# Patient Record
Sex: Male | Born: 1955 | Race: White | Hispanic: No | Marital: Married | Smoking: Never smoker
Health system: Southern US, Community
[De-identification: ages and names within clinical notes are randomized; demographics above are authoritative.]

## PROBLEM LIST (undated history)

## (undated) DIAGNOSIS — I219 Acute myocardial infarction, unspecified: Secondary | ICD-10-CM

## (undated) DIAGNOSIS — I10 Essential (primary) hypertension: Secondary | ICD-10-CM

## (undated) HISTORY — PX: CORONARY ARTERY BYPASS GRAFT: SHX141

---

## 2005-08-11 ENCOUNTER — Ambulatory Visit: Payer: Self-pay | Admitting: General Surgery

## 2007-01-27 ENCOUNTER — Encounter: Admission: RE | Admit: 2007-01-27 | Discharge: 2007-01-27 | Payer: Self-pay | Admitting: Specialist

## 2007-09-20 ENCOUNTER — Encounter: Admission: RE | Admit: 2007-09-20 | Discharge: 2007-09-20 | Payer: Self-pay | Admitting: Specialist

## 2010-12-03 ENCOUNTER — Inpatient Hospital Stay: Payer: Self-pay | Admitting: Internal Medicine

## 2010-12-18 ENCOUNTER — Ambulatory Visit: Payer: Self-pay | Admitting: Internal Medicine

## 2011-03-10 ENCOUNTER — Encounter: Payer: Self-pay | Admitting: Internal Medicine

## 2011-03-23 ENCOUNTER — Encounter: Payer: Self-pay | Admitting: Internal Medicine

## 2011-04-23 ENCOUNTER — Encounter: Payer: Self-pay | Admitting: Internal Medicine

## 2011-05-24 ENCOUNTER — Encounter: Payer: Self-pay | Admitting: Internal Medicine

## 2011-09-12 ENCOUNTER — Other Ambulatory Visit: Payer: Self-pay | Admitting: Internal Medicine

## 2011-09-12 ENCOUNTER — Ambulatory Visit: Payer: Self-pay | Admitting: Internal Medicine

## 2011-09-15 ENCOUNTER — Ambulatory Visit: Payer: Self-pay | Admitting: Internal Medicine

## 2011-10-30 ENCOUNTER — Ambulatory Visit: Payer: Self-pay | Admitting: Family Medicine

## 2011-12-16 ENCOUNTER — Encounter: Payer: Self-pay | Admitting: Internal Medicine

## 2011-12-22 ENCOUNTER — Encounter: Payer: Self-pay | Admitting: Internal Medicine

## 2012-01-21 ENCOUNTER — Encounter: Payer: Self-pay | Admitting: Internal Medicine

## 2012-02-21 ENCOUNTER — Encounter: Payer: Self-pay | Admitting: Internal Medicine

## 2013-11-18 ENCOUNTER — Ambulatory Visit: Payer: Self-pay | Admitting: Gastroenterology

## 2015-01-14 NOTE — H&P (Signed)
PATIENT NAME:  Derrick [name], Derrick [name] MR#:  [id number] DATE OF BIRTH:  Jan 27, 1956  DATE OF ADMISSION:  09/15/2011  REFERRING PHYSICIAN:  Dr. Sherrie MustacheFisher  DATE OF CARDIAC CATHETERIZATION: 09/15/2011  INDICATION: Unstable angina, coronary artery disease.   HISTORY OF PRESENT ILLNESS: Mr. 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  He underwent angioplasty and stenting of the distal RCA with [name] DES stent and did reasonably well. He was treated with beta blockers, ACE inhibitor, statin, Effient, and aspirin and did reasonably well. He was able to return to work, but over the last month or two he has had worsening chest discomfort on exertion, mostly with moderate exertion, sometimes at rest.  It got progressively worse. Because it was midsternal he finally presented and had [name] functional study [name] few days ago which showed diffuse ST-segment depression of 3 to 4 mm. He also had apical ischemia on the Myoview. He was then referred for cardiac catheterization today for further evaluation and management.   REVIEW OF SYSTEMS: No blackout spells or syncope. No nausea or vomiting. No fever, no chills, no sweats. No weight loss, no weight gain, no hemoptysis, no hematemesis, no bright red blood per rectum.   PAST MEDICAL HISTORY:  1. Angina.  2. Coronary artery disease.  3. Hypertension.  4. Hyperlipidemia.  5. Minimal elevation of troponin.   FAMILY HISTORY: Positive for cardiac disease.   SOCIAL HISTORY: Married, children. Still works. No smoking or alcohol consumption.  PAST SURGICAL HISTORY: Angioplasty and stenting with stent 11/2010.   Pulse oximetry 98%.   MEDICATIONS:  1. Aspirin 81 mg [name] day.  2. Metoprolol 12.5 twice [name] day.  3. Lisinopril 5 mg [name] day.  4. Lipitor 80 [name] day. 5. Effient 10 mg [name] day.   ALLERGIES: None.   PHYSICAL EXAMINATION:  VITAL SIGNS: Blood pressure 140/77,  pulse 50, respiratory rate 16, afebrile. Weight 147.  HEENT: Normocephalic, atraumatic. Pupils equal and reactive to light.   NECK: Supple. No JVD.  No carotid bruits or adenopathy.  LUNGS: Clear to auscultation and percussion. No significant wheeze, rhonchi, or rales.   HEART: Regular rate and rhythm. Positive bowel sounds. No rebound, guarding, or tenderness.   EXTREMITIES: Within normal limits.   NEUROLOGIC: Examination is intact.   SKIN: Normal.   LABORATORY, DIAGNOSTIC, AND RADIOLOGICAL DATA: BUN 17, creatinine 0.88, potassium 4.1, hemoglobin and hematocrit of 14 and 41, platelet count 270.   EKG: Normal sinus rhythm, left ventricular hypertrophy, diffuse J-point elevation, otherwise unremarkable. Myoview with apical ischemia.  Stress EKG with diffuse ST-segment depression with adequate exercise of 10 minutes with some angina during exercise.   ASSESSMENT:  1. Unstable angina.  2. Angina.  3. Coronary artery disease.  4. Hypertension.  5. Hyperlipidemia. 6. History of angioplasty and stenting.  7. Positive functional study.   PLAN: Proceed with cardiac catheterization for further evaluation and management of patient  with known coronary disease with strongly positive EKG on stress and positive Myoview.  We will probably proceed with cardiac catheterization for further evaluation and management.   ____________________________ Bobbie Stackwayne D. Juliann Paresallwood, MD [id number]:bjt D: 09/15/2011 10:31:05 ET T: 09/15/2011 11:20:36 ET JOB#: [id number]  cc: Dwayne D. Juliann Paresallwood, MD, <Dictator> Alwyn PeaWAYNE D CALLWOOD MD ELECTRONICALLY SIGNED 10/02/2011 5:58

## 2017-10-12 ENCOUNTER — Other Ambulatory Visit: Payer: Self-pay | Admitting: Internal Medicine

## 2017-10-12 DIAGNOSIS — R310 Gross hematuria: Secondary | ICD-10-CM

## 2017-10-14 ENCOUNTER — Other Ambulatory Visit: Payer: Self-pay | Admitting: Internal Medicine

## 2017-11-03 ENCOUNTER — Ambulatory Visit
Admission: RE | Admit: 2017-11-03 | Discharge: 2017-11-03 | Disposition: A | Discharge status: Home / Self Care | Payer: BLUE CROSS/BLUE SHIELD | Source: Ambulatory Visit | Attending: Internal Medicine | Admitting: Internal Medicine

## 2017-11-03 DIAGNOSIS — R310 Gross hematuria: Secondary | ICD-10-CM | POA: Diagnosis present

## 2017-11-03 DIAGNOSIS — N200 Calculus of kidney: Secondary | ICD-10-CM | POA: Diagnosis not present

## 2017-11-03 DIAGNOSIS — N281 Cyst of kidney, acquired: Secondary | ICD-10-CM | POA: Insufficient documentation

## 2018-11-05 ENCOUNTER — Other Ambulatory Visit: Payer: Self-pay | Admitting: Urology

## 2018-11-05 DIAGNOSIS — R310 Gross hematuria: Secondary | ICD-10-CM

## 2018-11-12 ENCOUNTER — Ambulatory Visit
Admission: RE | Admit: 2018-11-12 | Discharge: 2018-11-12 | Disposition: A | Discharge status: Home / Self Care | Payer: BLUE CROSS/BLUE SHIELD | Source: Ambulatory Visit | Attending: Urology | Admitting: Urology

## 2018-11-12 DIAGNOSIS — R310 Gross hematuria: Secondary | ICD-10-CM | POA: Insufficient documentation

## 2018-11-12 HISTORY — DX: Essential (primary) hypertension: I10

## 2018-11-12 LAB — POCT I-STAT CREATININE: Creatinine, Ser: 1 mg/dL (ref 0.61–1.24)

## 2018-11-12 MED ORDER — IOPAMIDOL (ISOVUE-300) INJECTION 61%
100.0000 mL | Freq: Once | INTRAVENOUS | Status: AC | PRN
  Administered 2018-11-12: 100 mL via INTRAVENOUS

## 2018-12-02 ENCOUNTER — Other Ambulatory Visit: Payer: Self-pay

## 2018-12-02 ENCOUNTER — Encounter: Payer: Self-pay | Admitting: *Deleted

## 2018-12-02 ENCOUNTER — Encounter
Admission: RE | Disposition: A | Discharge status: Home / Self Care | Payer: Self-pay | Source: Home / Self Care | Attending: Urology

## 2018-12-02 ENCOUNTER — Ambulatory Visit
Admission: RE | Admit: 2018-12-02 | Discharge: 2018-12-02 | Disposition: A | Discharge status: Home / Self Care | Payer: BLUE CROSS/BLUE SHIELD | Attending: Urology | Admitting: Urology

## 2018-12-02 DIAGNOSIS — Z955 Presence of coronary angioplasty implant and graft: Secondary | ICD-10-CM | POA: Diagnosis not present

## 2018-12-02 DIAGNOSIS — Z79899 Other long term (current) drug therapy: Secondary | ICD-10-CM | POA: Insufficient documentation

## 2018-12-02 DIAGNOSIS — I10 Essential (primary) hypertension: Secondary | ICD-10-CM | POA: Insufficient documentation

## 2018-12-02 DIAGNOSIS — N200 Calculus of kidney: Secondary | ICD-10-CM | POA: Diagnosis not present

## 2018-12-02 DIAGNOSIS — I2510 Atherosclerotic heart disease of native coronary artery without angina pectoris: Secondary | ICD-10-CM | POA: Diagnosis not present

## 2018-12-02 DIAGNOSIS — I252 Old myocardial infarction: Secondary | ICD-10-CM | POA: Diagnosis not present

## 2018-12-02 DIAGNOSIS — E7800 Pure hypercholesterolemia, unspecified: Secondary | ICD-10-CM | POA: Diagnosis not present

## 2018-12-02 DIAGNOSIS — Z951 Presence of aortocoronary bypass graft: Secondary | ICD-10-CM | POA: Insufficient documentation

## 2018-12-02 HISTORY — PX: EXTRACORPOREAL SHOCK WAVE LITHOTRIPSY: SHX1557

## 2018-12-02 HISTORY — DX: Acute myocardial infarction, unspecified: I21.9

## 2018-12-02 SURGERY — LITHOTRIPSY, ESWL
Anesthesia: Moderate Sedation | Laterality: Left

## 2018-12-02 MED ORDER — PROMETHAZINE HCL 25 MG/ML IJ SOLN
25.0000 mg | Freq: Once | INTRAMUSCULAR | Status: AC
  Administered 2018-12-02: 25 mg via INTRAMUSCULAR

## 2018-12-02 MED ORDER — ONDANSETRON HCL 4 MG/2ML IJ SOLN
INTRAMUSCULAR | Status: AC
  Administered 2018-12-02: 4 mg via INTRAVENOUS
  Filled 2018-12-02: qty 2

## 2018-12-02 MED ORDER — SODIUM CHLORIDE FLUSH 0.9 % IV SOLN
INTRAVENOUS | Status: AC
  Filled 2018-12-02: qty 10

## 2018-12-02 MED ORDER — ONDANSETRON HCL 4 MG/2ML IJ SOLN
4.0000 mg | Freq: Once | INTRAMUSCULAR | Status: AC
  Administered 2018-12-02: 4 mg via INTRAVENOUS

## 2018-12-02 MED ORDER — LEVOFLOXACIN 500 MG PO TABS
500.0000 mg | ORAL_TABLET | Freq: Once | ORAL | Status: AC
  Administered 2018-12-02: 500 mg via ORAL

## 2018-12-02 MED ORDER — MORPHINE SULFATE (PF) 10 MG/ML IV SOLN
INTRAVENOUS | Status: AC
  Administered 2018-12-02: 10 mg via INTRAMUSCULAR
  Filled 2018-12-02: qty 1

## 2018-12-02 MED ORDER — DEXTROSE-NACL 5-0.45 % IV SOLN
INTRAVENOUS | Status: DC
  Administered 2018-12-02: 12:00:00 via INTRAVENOUS

## 2018-12-02 MED ORDER — FUROSEMIDE 10 MG/ML IJ SOLN
INTRAMUSCULAR | Status: AC
  Administered 2018-12-02: 10 mg via INTRAVENOUS
  Filled 2018-12-02: qty 2

## 2018-12-02 MED ORDER — MORPHINE SULFATE (PF) 10 MG/ML IV SOLN
10.0000 mg | Freq: Once | INTRAVENOUS | Status: AC
  Administered 2018-12-02: 10 mg via INTRAMUSCULAR

## 2018-12-02 MED ORDER — FUROSEMIDE 10 MG/ML IJ SOLN
10.0000 mg | Freq: Once | INTRAMUSCULAR | Status: AC
  Administered 2018-12-02: 10 mg via INTRAVENOUS

## 2018-12-02 MED ORDER — PROMETHAZINE HCL 25 MG/ML IJ SOLN
INTRAMUSCULAR | Status: AC
  Administered 2018-12-02: 25 mg via INTRAMUSCULAR
  Filled 2018-12-02: qty 1

## 2018-12-02 MED ORDER — DIPHENHYDRAMINE HCL 25 MG PO CAPS
ORAL_CAPSULE | ORAL | Status: AC
  Administered 2018-12-02: 25 mg via ORAL
  Filled 2018-12-02: qty 1

## 2018-12-02 MED ORDER — MIDAZOLAM HCL 2 MG/2ML IJ SOLN
1.0000 mg | Freq: Once | INTRAMUSCULAR | Status: AC
  Administered 2018-12-02: 1 mg via INTRAMUSCULAR

## 2018-12-02 MED ORDER — LEVOFLOXACIN 500 MG PO TABS
ORAL_TABLET | ORAL | Status: AC
  Administered 2018-12-02: 500 mg via ORAL
  Filled 2018-12-02: qty 1

## 2018-12-02 MED ORDER — TAMSULOSIN HCL 0.4 MG PO CAPS: 0.4000 mg | ORAL_CAPSULE | Freq: Every day | ORAL | 0 refills | Status: AC

## 2018-12-02 MED ORDER — ONDANSETRON 8 MG PO TBDP: 4.0000 mg | ORAL_TABLET | Freq: Four times a day (QID) | ORAL | 3 refills | Status: AC | PRN

## 2018-12-02 MED ORDER — MIDAZOLAM HCL 2 MG/2ML IJ SOLN
INTRAMUSCULAR | Status: AC
  Administered 2018-12-02: 1 mg via INTRAMUSCULAR
  Filled 2018-12-02: qty 2

## 2018-12-02 MED ORDER — DIPHENHYDRAMINE HCL 25 MG PO CAPS
25.0000 mg | ORAL_CAPSULE | ORAL | Status: AC
  Administered 2018-12-02: 25 mg via ORAL

## 2018-12-02 MED ORDER — ACETAMINOPHEN-CODEINE #3 300-30 MG PO TABS: 1.0000 | ORAL_TABLET | ORAL | 2 refills | Status: AC | PRN

## 2018-12-02 NOTE — Discharge Instructions (Signed)
Kidney Stones ° °Kidney stones (urolithiasis) are rock-like masses that form inside of the kidneys. Kidneys are organs that make pee (urine). A kidney stone can cause very bad pain and can block the flow of pee. The stone usually leaves your body (passes) through your pee. You may need to have a doctor take out the stone. °Follow these instructions at home: °Eating and drinking °· Drink enough fluid to keep your pee clear or pale yellow. This will help you pass the stone. °· If told by your doctor, change the foods you eat (your diet). This may include: °? Limiting how much salt (sodium) you eat. °? Eating more fruits and vegetables. °? Limiting how much meat, poultry, fish, and eggs you eat. °· Follow instructions from your doctor about eating or drinking restrictions. °General instructions °· Collect pee samples as told by your doctor. You may need to collect a pee sample: °? 24 hours after a stone comes out. °? 8-12 weeks after a stone comes out, and every 6-12 months after that. °· Strain your pee every time you pee (urinate), for as long as told. Use the strainer that your doctor recommends. °· Do not throw out the stone. Keep it so that it can be tested by your doctor. °· Take over-the-counter and prescription medicines only as told by your doctor. °· Keep all follow-up visits as told by your doctor. This is important. You may need follow-up tests. °Preventing kidney stones °To prevent another kidney stone: °· Drink enough fluid to keep your pee clear or pale yellow. This is the best way to prevent kidney stones. °· Eat healthy foods. °· Avoid certain foods as told by your doctor. You may be told to eat less protein. °· Stay at a healthy weight. °Contact a doctor if: °· You have pain that gets worse or does not get better with medicine. °Get help right away if: °· You have a fever or chills. °· You get very bad pain. °· You get new pain in your belly (abdomen). °· You pass out (faint). °· You cannot pee. °This  information is not intended to replace advice given to you by your health care provider. Make sure you discuss any questions you have with your health care provider. °Document Released: 02/25/2008 Document Revised: 05/27/2016 Document Reviewed: 05/27/2016 °Elsevier Interactive Patient Education © 2019 [employer] ° ° °Lithotripsy, Care After °This sheet gives you information about how to care for yourself after your procedure. Your health care provider may also give you more specific instructions. If you have problems or questions, contact your health care provider. °What can I expect after the procedure? °After the procedure, it is common to have: °· Some blood in your urine. This should only last for a few days. °· Soreness in your back, sides, or upper abdomen for a few days. °· Blotches or bruises on your back where the pressure wave entered the skin. °· Pain, discomfort, or nausea when pieces (fragments) of the kidney stone move through the tube that carries urine from the kidney to the bladder (ureter). Stone fragments may pass soon after the procedure, but they may continue to pass for up to 4-8 weeks. °? If you have severe pain or nausea, contact your health care provider. This may be caused by a large stone that was not broken up, and this may mean that you need more treatment. °· Some pain or discomfort during urination. °· Some pain or discomfort in the lower abdomen or (in men)   at the base of the penis. °Follow these instructions at home: °Medicines °· Take over-the-counter and prescription medicines only as told by your health care provider. °· If you were prescribed an antibiotic medicine, take it as told by your health care provider. Do not stop taking the antibiotic even if you start to feel better. °· Do not drive for 24 hours if you were given a medicine to help you relax (sedative). °· Do not drive or use heavy machinery while taking prescription pain medicine. °Eating and drinking ° °   ° °· Drink enough water and fluids to keep your urine clear or pale yellow. This helps any remaining pieces of the stone to pass. It can also help prevent new stones from forming. °· Eat plenty of fresh fruits and vegetables. °· Follow instructions from your health care provider about eating and drinking restrictions. You may be instructed: °? To reduce how much salt (sodium) you eat or drink. Check ingredients and nutrition facts on packaged foods and beverages. °? To reduce how much meat you eat. °· Eat the recommended amount of calcium for your age and gender. Ask your health care provider how much calcium you should have. °General instructions °· Get plenty of rest. °· Most people can resume normal activities 1-2 days after the procedure. Ask your health care provider what activities are safe for you. °· Your health care provider may direct you to lie in a certain position (postural drainage) and tap firmly (percuss) over your kidney area to help stone fragments pass. Follow instructions as told by your health care provider. °· If directed, strain all urine through the strainer that was provided by your health care provider. °? Keep all fragments for your health care provider to see. Any stones that are found may be sent to a medical lab for examination. The stone may be as small as a grain of salt. °· Keep all follow-up visits as told by your health care provider. This is important. °Contact a health care provider if: °· You have pain that is severe or does not get better with medicine. °· You have nausea that is severe or does not go away. °· You have blood in your urine longer than your health care provider told you to expect. °· You have more blood in your urine. °· You have pain during urination that does not go away. °· You urinate more frequently than usual and this does not go away. °· You develop a rash or any other possible signs of an allergic reaction. °Get help right away if: °· You have severe  pain in your back, sides, or upper abdomen. °· You have severe pain while urinating. °· Your urine is very dark red. °· You have blood in your stool (feces). °· You cannot pass any urine at all. °· You feel a strong urge to urinate after emptying your bladder. °· You have a fever or chills. °· You develop shortness of breath, difficulty breathing, or chest pain. °· You have severe nausea that leads to persistent vomiting. °· You faint. °Summary °· After this procedure, it is common to have some pain, discomfort, or nausea when pieces (fragments) of the kidney stone move through the tube that carries urine from the kidney to the bladder (ureter). If this pain or nausea is severe, however, you should contact your health care provider. °· Most people can resume normal activities 1-2 days after the procedure. Ask your health care provider what activities are safe for   you. °· Drink enough water and fluids to keep your urine clear or pale yellow. This helps any remaining pieces of the stone to pass, and it can help prevent new stones from forming. °· If directed, strain your urine and keep all fragments for your health care provider to see. Fragments or stones may be as small as a grain of salt. °· Get help right away if you have severe pain in your back, sides, or upper abdomen or have severe pain while urinating. °This information is not intended to replace advice given to you by your health care provider. Make sure you discuss any questions you have with your health care provider. °Document Released: 09/28/2007 Document Revised: 02/17/2018 Document Reviewed: 07/30/2016 °Elsevier Interactive Patient Education © 2019 [employer] ° ° °Lithotripsy ° °Lithotripsy is a treatment that can sometimes help eliminate kidney stones and the pain that they cause. A form of lithotripsy, also known as extracorporeal shock wave lithotripsy, is a nonsurgical procedure that crushes a kidney stone with shock waves. These shock waves  pass through your body and focus on the kidney stone. They cause the kidney stone to break up while it is still in the urinary tract. This makes it easier for the smaller pieces of stone to pass in the urine. °Tell a health care provider about: °· Any allergies you have. °· All medicines you are taking, including vitamins, herbs, eye drops, creams, and over-the-counter medicines. °· Any blood disorders you have. °· Any surgeries you have had. °· Any medical conditions you have. °· Whether you are pregnant or may be pregnant. °· Any problems you or family members have had with anesthetic medicines. °What are the risks? °Generally, this is a safe procedure. However, problems may occur, including: °· Infection. °· Bleeding of the kidney. °· Bruising of the kidney or skin. °· Scarring of the kidney, which can lead to: °? Increased blood pressure. °? Poor kidney function. °? Return (recurrence) of kidney stones. °· Damage to other structures or organs, such as the liver, colon, spleen, or pancreas. °· Blockage (obstruction) of the the tube that carries urine from the kidney to the bladder (ureter). °· Failure of the kidney stone to break into pieces (fragments). °What happens before the procedure? °Staying hydrated °Follow instructions from your health care provider about hydration, which may include: °· Up to 2 hours before the procedure - you may continue to drink clear liquids, such as water, clear fruit juice, black coffee, and plain tea. °Eating and drinking restrictions °Follow instructions from your health care provider about eating and drinking, which may include: °· 8 hours before the procedure - stop eating heavy meals or foods such as meat, fried foods, or fatty foods. °· 6 hours before the procedure - stop eating light meals or foods, such as toast or cereal. °· 6 hours before the procedure - stop drinking milk or drinks that contain milk. °· 2 hours before the procedure - stop drinking clear  liquids. °General instructions °· Plan to have someone take you home from the hospital or clinic. °· Ask your health care provider about: °? Changing or stopping your regular medicines. This is especially important if you are taking diabetes medicines or blood thinners. °? Taking medicines such as aspirin and ibuprofen. These medicines and other NSAIDs can thin your blood. Do not take these medicines for 7 days before your procedure if your health care provider instructs you not to. °· You may have tests, such as: °? Blood tests. °?   Urine tests. °? Imaging tests, such as a CT scan. °What happens during the procedure? °· To lower your risk of infection: °? Your health care team will wash or sanitize their hands. °? Your skin will be washed with soap. °· An IV tube will be inserted into one of your veins. This tube will give you fluids and medicines. °· You will be given one or more of the following: °? A medicine to help you relax (sedative). °? A medicine to make you fall asleep (general anesthetic). °· A water-filled cushion may be placed behind your kidney or on your abdomen. In some cases you may be placed in a tub of lukewarm water. °· Your body will be positioned in a way that makes it easy to target the kidney stone. °· A flexible tube with holes in it (stent) may be placed in the ureter. This will help keep urine flowing from the kidney if the fragments of the stone have been blocking the ureter. °· An X-ray or ultrasound exam will be done to locate your stone. °· Shock waves will be aimed at the stone. If you are awake, you may feel a tapping sensation as the shock waves pass through your body. °The procedure may vary among health care providers and hospitals. °What happens after the procedure? °· You may have an X-ray to see whether the procedure was able to break up the kidney stone and how much of the stone has passed. If large stone fragments remain after treatment, you may need to have a second  procedure at a later time. °· Your blood pressure, heart rate, breathing rate, and blood oxygen level will be monitored until the medicines you were given have worn off. °· You may be given antibiotics or pain medicine as needed. °· If a stent was placed in your ureter during surgery, it may stay in place for a few weeks. °· You may need strain your urine to collect pieces of the kidney stone for testing. °· You will need to drink plenty of water. °· Do not drive for 24 hours if you were given a sedative. °Summary °· Lithotripsy is a treatment that can sometimes help eliminate kidney stones and the pain that they cause. °· A form of lithotripsy, also known as extracorporeal shock wave lithotripsy, is a nonsurgical procedure that crushes a kidney stone with shock waves. °· Generally, this is a safe procedure. However, problems may occur, including damage to the kidney or other organs, infection, or obstruction of the tube that carries urine from the kidney to the bladder (ureter). °· When you go home, you will need to drink plenty of water. You may be asked to strain your urine to collect pieces of the kidney stone for testing. °This information is not intended to replace advice given to you by your health care provider. Make sure you discuss any questions you have with your health care provider. °Document Released: 09/05/2000 Document Revised: 09/24/2017 Document Reviewed: 07/30/2016 °Elsevier Interactive Patient Education © 2019 [employer] ° °

## 2018-12-02 NOTE — Progress Notes (Signed)
Pt vomited about 300 ml per patients wife of clear fluid. IV Zofran given. Pt unable to void after lasix admin. Made Dr Sheppard Penton aware advised pt drink Gatorade and stated pt okay to go home. VS stable pt and wife verbalized understanding of plan of care and are agreeable.

## 2019-10-21 IMAGING — CT CT ABD-PEL WO/W CM
3 of 12 series · 11 of 46 positions shown, 17 images · IV contrast (iopamidol)
Comparison: Renal ultrasound 11/03/2017.  No prior CT.

CLINICAL DATA: Gross hematuria 2 months ago. Microscopic hematuria
currently. History kidney stones. Prostatomegaly. Denies dysuria.

EXAM:
CT ABDOMEN AND PELVIS WITHOUT AND WITH CONTRAST
TECHNIQUE: Multidetector CT imaging of the abdomen and pelvis was performed
following the standard protocol before and following the bolus
administration of intravenous contrast.
CONTRAST:  100mL BXXGX6-D22 IOPAMIDOL (BXXGX6-D22) INJECTION 61%

[Series 5: cor without without pre · coronal · non-contrast · 0.73mm/px · 2 of 134 slices shown, 3 images]
[im 45/134  soft-tissue]
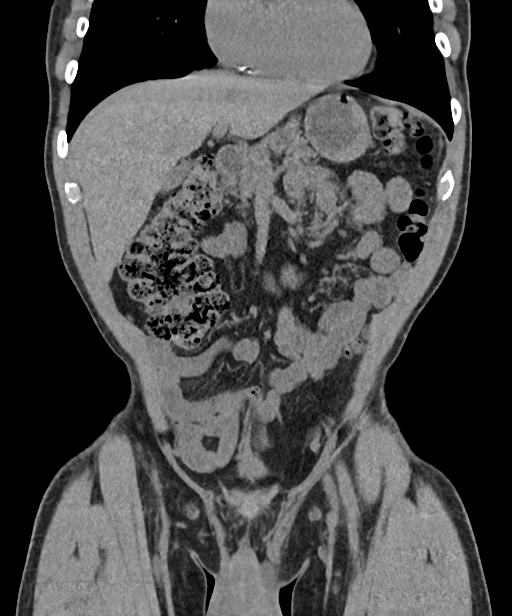
[im 45/134  bone]
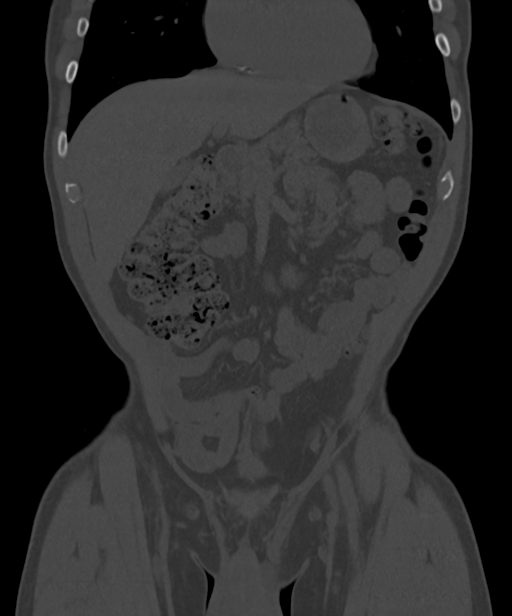
[im 89/134  soft-tissue]
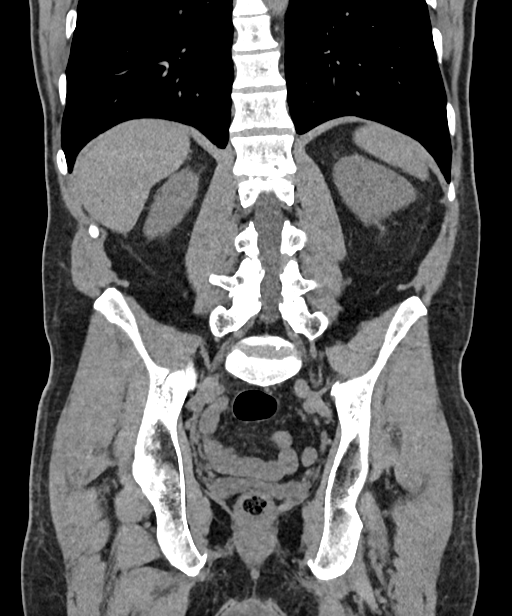

[Series 9: axial with hematuria with · axial · 0.73mm/px · z∈[-1653,-1288]mm · 7 of 99 slices shown, 12 images]
[im 13/99  soft-tissue]
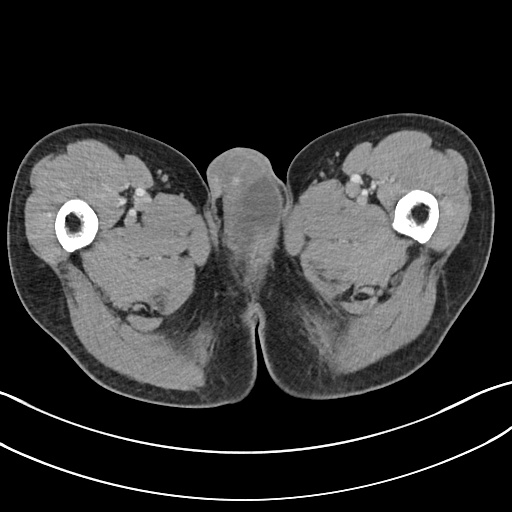
[im 13/99  bone]
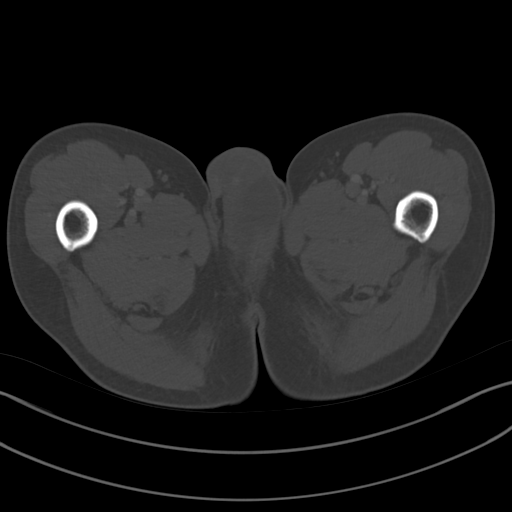
[im 25/99  soft-tissue]
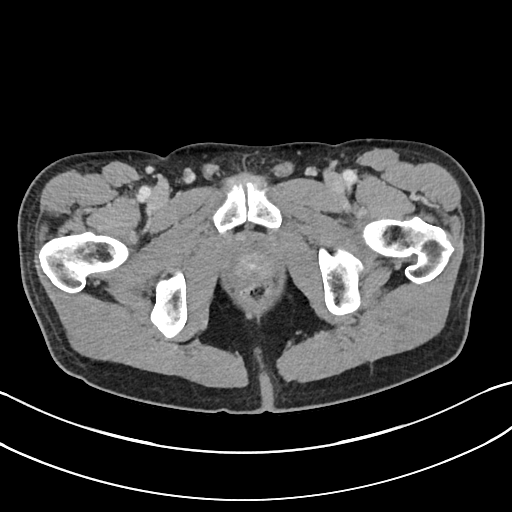
[im 37/99  soft-tissue]
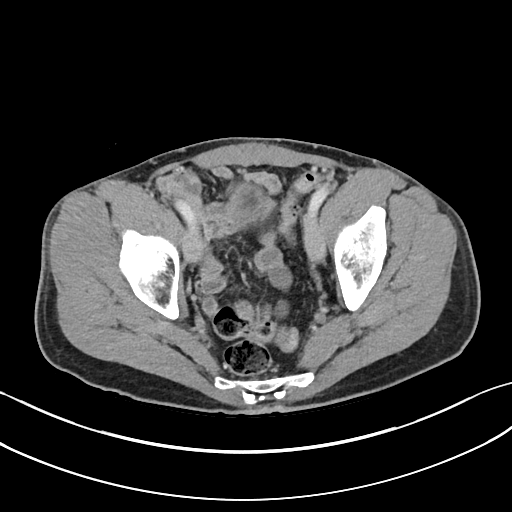
[im 50/99  soft-tissue]
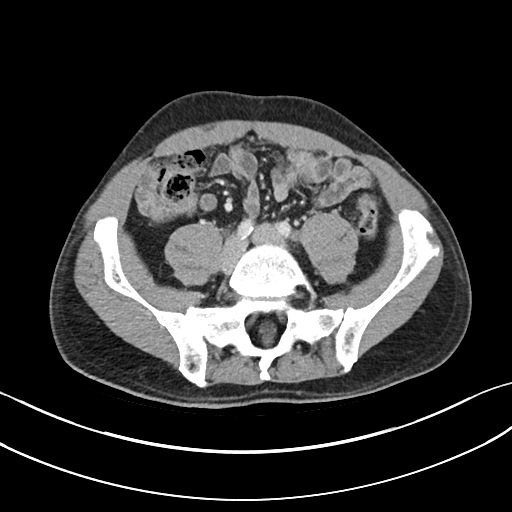
[im 50/99  lung]
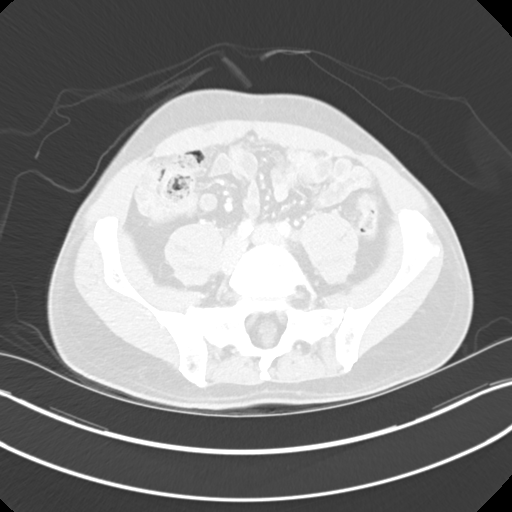
[im 62/99  soft-tissue]
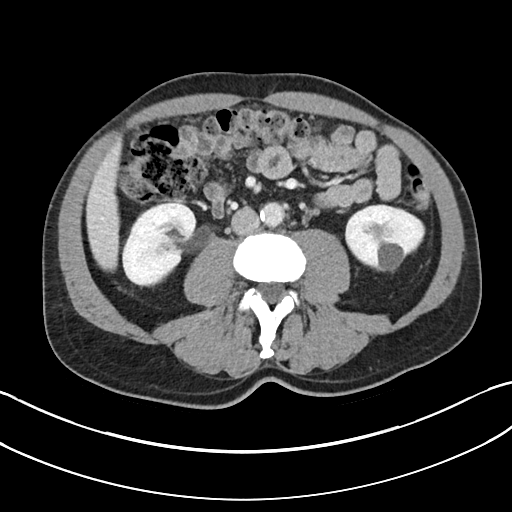
[im 62/99  lung]
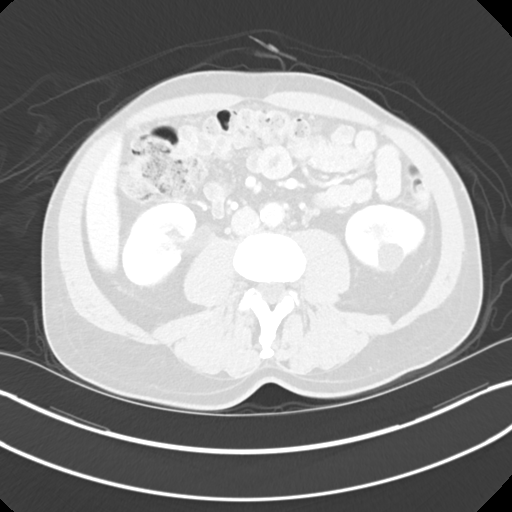
[im 74/99  soft-tissue]
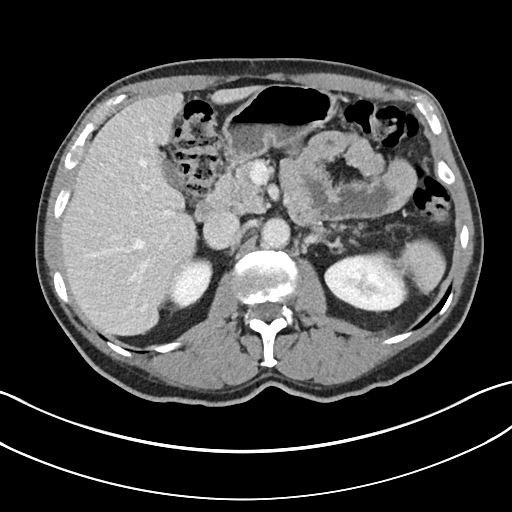
[im 74/99  lung]
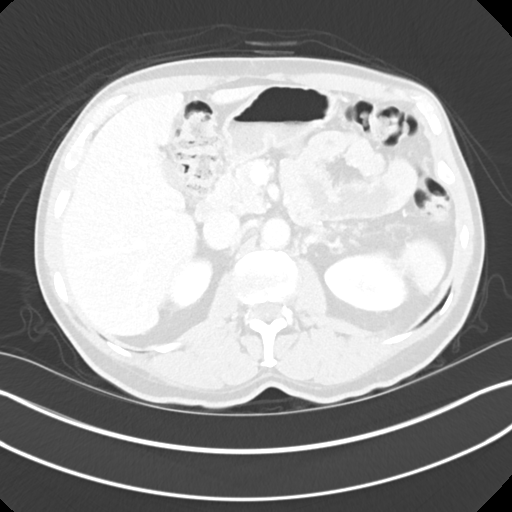
[im 86/99  soft-tissue]
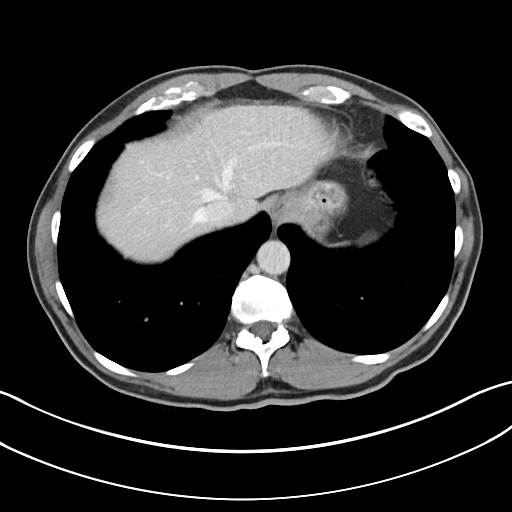
[im 86/99  lung]
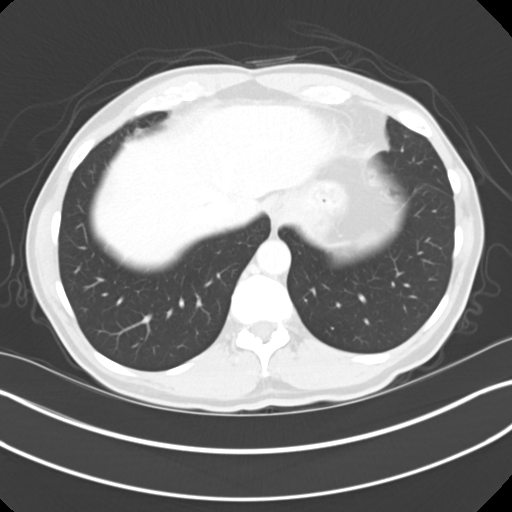

[Series 17: axial delay delay prone · axial · delayed · 0.70mm/px · z∈[-1635,-1575]mm · 2 of 87 slices shown]
[im 13/87  soft-tissue]
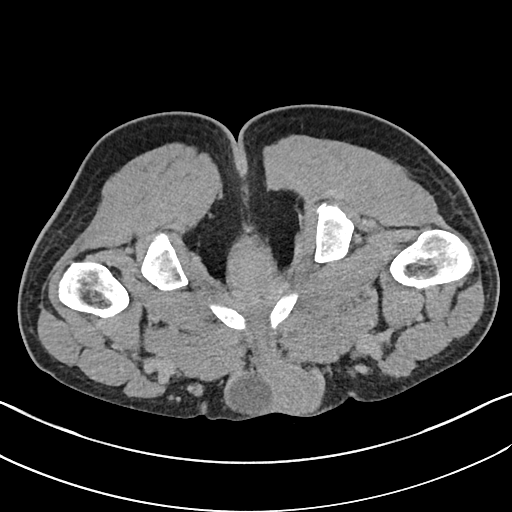
[im 25/87  soft-tissue]
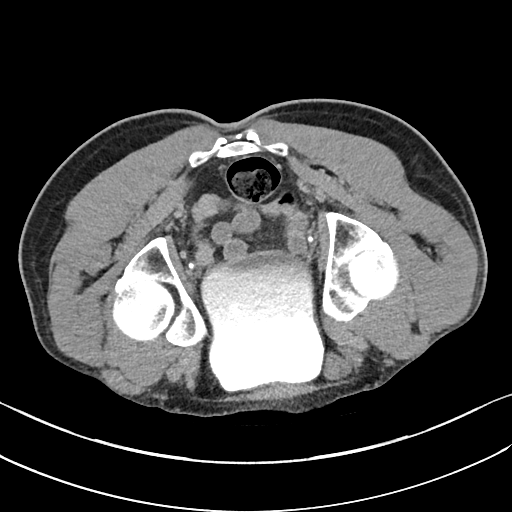

[11 of 46 positions shown; findings below may reference images not displayed]

FINDINGS: Lower chest: A 3 mm right middle lobe pulmonary nodule on image [DATE]
and image [DATE]. Normal heart size without pericardial or pleural
effusion. Median sternotomy with native coronary artery
calcification.

Hepatobiliary: Normal liver. Normal gallbladder, without biliary
ductal dilatation.

Pancreas: Normal, without mass or ductal dilatation.

Spleen: Normal in size, without focal abnormality.

Adrenals/Urinary Tract: Mild bilateral adrenal nodularity, greater
on the left.

4 mm lower pole left renal collecting system calculus.

A dominant stone within a left extrarenal pelvis measures 99x90 mm
on image [DATE]. There is mild wall thickening of and edema
surrounding the renal pelvis including on image 33/9, likely
secondary.

No hydronephrosis. No hydroureter or ureteric calculi. No bladder
calculi.

Lower pole left renal 1.9 cm minimally complex cyst, without
post-contrast enhancement.

Good renal collecting system opacification on delayed images. Good
ureteric opacification, without filling defect.

No enhancing bladder mass or filling defect on delayed images.

Stomach/Bowel: Normal stomach, without wall thickening. Normal
colon, appendix, and terminal ileum. Normal small bowel.

Vascular/Lymphatic: Aortic and branch vessel atherosclerosis.
Retroaortic left renal vein or veins. No abdominopelvic adenopathy.

Reproductive: Normal prostate. Midline penile calcification is
likely dystrophic on image 81/9. A large left hydrocele is
incompletely imaged.

Other: No significant free fluid.

Musculoskeletal: No acute osseous abnormality.
IMPRESSION: 1. Dominant stone within a left extrarenal pelvis, maximally 1.3 cm.
Surrounding renal pelvic wall thickening and edema are likely
secondary. No overt hydronephrosis.
2. Left nephrolithiasis.
3. Incompletely imaged large left hydrocele.
4.  Aortic Atherosclerosis ([id number]).
5. 3 mm right middle lobe pulmonary nodule. No follow-up needed if
patient is low-risk. Non-contrast chest CT can be considered in 12
months if patient is high-risk. This recommendation follows the
consensus statement: Guidelines for Management of Incidental
Pulmonary Nodules Detected on CT Images: From the [HOSPITAL]

## 2023-12-07 ENCOUNTER — Other Ambulatory Visit: Payer: Self-pay | Admitting: Internal Medicine

## 2023-12-07 DIAGNOSIS — I2089 Other forms of angina pectoris: Secondary | ICD-10-CM

## 2023-12-07 DIAGNOSIS — Z951 Presence of aortocoronary bypass graft: Secondary | ICD-10-CM

## 2023-12-16 ENCOUNTER — Telehealth (HOSPITAL_COMMUNITY): Payer: Self-pay | Admitting: *Deleted

## 2023-12-16 NOTE — Telephone Encounter (Signed)
 Reaching out to patient to offer assistance regarding upcoming cardiac imaging study; pt verbalizes understanding of appt date/time, but wishes to reschedule. New appointment made for April 3 at 10 AM.  He verbalized understanding of new appt date and time.  Larey Brick RN Navigator Cardiac Imaging [facility] [phone] office [phone] cell

## 2023-12-17 ENCOUNTER — Ambulatory Visit: Admission: RE | Admit: 2023-12-17 | Source: Ambulatory Visit

## 2023-12-23 ENCOUNTER — Telehealth (HOSPITAL_COMMUNITY): Payer: Self-pay | Admitting: *Deleted

## 2023-12-23 NOTE — Telephone Encounter (Signed)
 Reaching out to patient to offer assistance regarding upcoming cardiac imaging study; pt verbalizes understanding of appt date/time, parking situation and where to check in, pre-test NPO status and medications ordered, and verified current allergies; name and call back number provided for further questions should they arise Johney Frame RN Navigator Cardiac Imaging [facility] [phone] office [phone] cell

## 2023-12-24 ENCOUNTER — Ambulatory Visit
Admission: RE | Admit: 2023-12-24 | Discharge: 2023-12-24 | Disposition: A | Discharge status: Home / Self Care | Source: Ambulatory Visit | Attending: Internal Medicine | Admitting: Internal Medicine

## 2023-12-24 DIAGNOSIS — Z951 Presence of aortocoronary bypass graft: Secondary | ICD-10-CM | POA: Diagnosis not present

## 2023-12-24 DIAGNOSIS — I2089 Other forms of angina pectoris: Secondary | ICD-10-CM | POA: Diagnosis not present

## 2023-12-24 MED ORDER — SODIUM CHLORIDE 0.9 % IV SOLN: INTRAVENOUS | Status: DC

## 2023-12-24 MED ORDER — IOHEXOL 350 MG/ML SOLN
75.0000 mL | Freq: Once | INTRAVENOUS | Status: AC | PRN
  Administered 2023-12-24: 75 mL via INTRAVENOUS

## 2023-12-24 MED ORDER — NITROGLYCERIN 0.4 MG SL SUBL
0.8000 mg | SUBLINGUAL_TABLET | Freq: Once | SUBLINGUAL | Status: AC
  Administered 2023-12-24: 0.8 mg via SUBLINGUAL

## 2023-12-24 NOTE — Progress Notes (Signed)
 Patient tolerated CT well. Drank water after. Vital signs stable encourage to drink water throughout day.Reasons explained and verbalized understanding. Ambulated steady gait.
